# Patient Record
Sex: Male | Born: 1985 | Race: Black or African American | Hispanic: No | Marital: Single | Smoking: Former smoker
Health system: Southern US, Community
[De-identification: ages and names within clinical notes are randomized; demographics above are authoritative.]

## PROBLEM LIST (undated history)

## (undated) DIAGNOSIS — R569 Unspecified convulsions: Secondary | ICD-10-CM

---

## 2016-12-02 ENCOUNTER — Emergency Department
Admission: EM | Admit: 2016-12-02 | Discharge: 2016-12-02 | Disposition: A | Discharge status: Home / Self Care | Payer: Self-pay | Attending: Emergency Medicine | Admitting: Emergency Medicine

## 2016-12-02 DIAGNOSIS — R197 Diarrhea, unspecified: Secondary | ICD-10-CM | POA: Insufficient documentation

## 2016-12-02 DIAGNOSIS — R1084 Generalized abdominal pain: Secondary | ICD-10-CM | POA: Insufficient documentation

## 2016-12-02 DIAGNOSIS — R509 Fever, unspecified: Secondary | ICD-10-CM | POA: Insufficient documentation

## 2016-12-02 DIAGNOSIS — F17210 Nicotine dependence, cigarettes, uncomplicated: Secondary | ICD-10-CM | POA: Insufficient documentation

## 2016-12-02 HISTORY — DX: Unspecified convulsions: R56.9

## 2016-12-02 LAB — COMPREHENSIVE METABOLIC PANEL
ALK PHOS: 85 U/L (ref 38–126)
ALT: 30 U/L (ref 17–63)
AST: 30 U/L (ref 15–41)
Albumin: 3.8 g/dL (ref 3.5–5.0)
Anion gap: 11 (ref 5–15)
BILIRUBIN TOTAL: 0.7 mg/dL (ref 0.3–1.2)
BUN: 9 mg/dL (ref 6–20)
CALCIUM: 9.2 mg/dL (ref 8.9–10.3)
CHLORIDE: 97 mmol/L — AB (ref 101–111)
CO2: 24 mmol/L (ref 22–32)
CREATININE: 1.07 mg/dL (ref 0.61–1.24)
GFR calc Af Amer: >60 mL/min (ref >60)
GLUCOSE: 108 mg/dL — AB (ref 65–99)
Potassium: 3.3 mmol/L — ABNORMAL LOW (ref 3.5–5.1)
Sodium: 132 mmol/L — ABNORMAL LOW (ref 135–145)
Total Protein: 8.9 g/dL — ABNORMAL HIGH (ref 6.5–8.1)

## 2016-12-02 LAB — CBC
HCT: 42.9 % (ref 40.0–52.0)
HEMOGLOBIN: 15.2 g/dL (ref 13.0–18.0)
MCH: 28.8 pg (ref 26.0–34.0)
MCHC: 35.4 g/dL (ref 32.0–36.0)
MCV: 81.5 fL (ref 80.0–100.0)
PLATELETS: 266 10*3/uL (ref 150–440)
RBC: 5.26 MIL/uL (ref 4.40–5.90)
RDW: 13 % (ref 11.5–14.5)
WBC: 11 10*3/uL — ABNORMAL HIGH (ref 3.8–10.6)

## 2016-12-02 LAB — URINALYSIS, COMPLETE (UACMP) WITH MICROSCOPIC
Bacteria, UA: NONE SEEN
Bilirubin Urine: NEGATIVE
GLUCOSE, UA: NEGATIVE mg/dL
Hgb urine dipstick: NEGATIVE
KETONES UR: 20 mg/dL — AB
Leukocytes, UA: NEGATIVE
Nitrite: NEGATIVE
PROTEIN: 100 mg/dL — AB
Specific Gravity, Urine: 1.038 — ABNORMAL HIGH (ref 1.005–1.030)
pH: 5 (ref 5.0–8.0)

## 2016-12-02 LAB — LIPASE, BLOOD: Lipase: 19 U/L (ref 11–51)

## 2016-12-02 MED ORDER — ONDANSETRON HCL 4 MG PO TABS: 4.0000 mg | ORAL_TABLET | Freq: Three times a day (TID) | ORAL | 0 refills | Status: DC | PRN

## 2016-12-02 MED ORDER — KETOROLAC TROMETHAMINE 30 MG/ML IJ SOLN
15.0000 mg | Freq: Once | INTRAMUSCULAR | Status: AC
  Administered 2016-12-02: 15 mg via INTRAVENOUS
  Filled 2016-12-02: qty 1

## 2016-12-02 MED ORDER — ACETAMINOPHEN 500 MG PO TABS
1000.0000 mg | ORAL_TABLET | Freq: Once | ORAL | Status: AC
  Administered 2016-12-02: 1000 mg via ORAL
  Filled 2016-12-02: qty 2

## 2016-12-02 MED ORDER — ONDANSETRON HCL 4 MG/2ML IJ SOLN
4.0000 mg | Freq: Once | INTRAMUSCULAR | Status: AC
  Administered 2016-12-02: 4 mg via INTRAVENOUS
  Filled 2016-12-02: qty 2

## 2016-12-02 MED ORDER — SODIUM CHLORIDE 0.9 % IV BOLUS (SEPSIS)
1000.0000 mL | Freq: Once | INTRAVENOUS | Status: AC
  Administered 2016-12-02: 1000 mL via INTRAVENOUS

## 2016-12-02 NOTE — ED Notes (Signed)

## 2016-12-02 NOTE — ED Provider Notes (Addendum)
[facility] Emergency Department Provider Note  ____________________________________________  Time seen: Approximately 6:50 PM  I have reviewed the triage vital signs and the nursing notes.   HISTORY  Chief Complaint Abdominal Pain   HPI Craig Shepard is a 31 y.o. male the history of seizure disorder who presents for evaluation of abdominal pain and fever. Patient reports 5 days of decreased appetite, generalized cramping intermittent abdominal pain, currently 8/10, 2 daily episodes of watery diarrhea, and nausea. Has had a fever for the last 2 days. He denies any prior abdominal surgeries. No vomiting. No chest pain or shortness of breath, no dysuria or hematuria. He denies any known sick contact exposures.No melena, no bright red blood per rectum. No recent antibiotic use, no history of C. difficile.  Past Medical History:  Diagnosis Date  . Seizures (HCC)     There are no active problems to display for this patient.   History reviewed. No pertinent surgical history.  Prior to Admission medications   Not on File    Allergies Patient has no known allergies.  No family history on file.  Social History Social History  Substance Use Topics  . Smoking status: Current Some Day Smoker  . Smokeless tobacco: Not on file  . Alcohol use No    Review of Systems  Constitutional: + fever. Eyes: Negative for visual changes. ENT: Negative for sore throat. Neck: No neck pain  Cardiovascular: Negative for chest pain. Respiratory: Negative for shortness of breath. Gastrointestinal: + diffuse cramping abdominal pain, nausea, and diarrhea. No vomiting Genitourinary: Negative for dysuria. Musculoskeletal: Negative for back pain. Skin: Negative for rash. Neurological: Negative for headaches, weakness or numbness. Psych: No SI or HI  ____________________________________________   PHYSICAL EXAM:  VITAL SIGNS: ED Triage Vitals  Enc Vitals Group     BP 12/02/16 1839 124/73     Pulse Rate 12/02/16 1839 94     Resp 12/02/16 1839 18     Temp 12/02/16 1839 (!) 102.7 F (39.3 C)     Temp Source 12/02/16 1839 Oral     SpO2 12/02/16 1839 97 %     Weight 12/02/16 1837 250 lb (113.4 kg)     Height 12/02/16 1837  (1.803 m)     Head Circumference --      Peak Flow --      Pain Score --      Pain Loc --      Pain Edu? --      Excl. in GC? --     Constitutional: Alert and oriented. Well appearing and in no apparent distress. HEENT:      Head: Normocephalic and atraumatic.         Eyes: Conjunctivae are normal. Sclera is non-icteric.       Mouth/Throat: Mucous membranes are moist.       Neck: Supple with no signs of meningismus. Cardiovascular: Regular rate and rhythm. No murmurs, gallops, or rubs. 2+ symmetrical distal pulses are present in all extremities. No JVD. Respiratory: Normal respiratory effort. Lungs are clear to auscultation bilaterally. No wheezes, crackles, or rhonchi.  Gastrointestinal: Obese, soft, non tender, and non distended with positive bowel sounds. No rebound or guarding. Genitourinary: No CVA tenderness. Musculoskeletal: Nontender with normal range of motion in all extremities. No edema, cyanosis, or erythema of extremities. Neurologic: Normal speech and language. Face is symmetric. Moving all extremities. No gross focal neurologic deficits are appreciated. Skin: Skin is warm, dry and intact. No rash noted.  Psychiatric: Mood and affect are normal. Speech and behavior are normal.  ____________________________________________   LABS (all labs ordered are listed, but only abnormal results are displayed)  Labs Reviewed  COMPREHENSIVE METABOLIC PANEL - Abnormal; Notable for the following:       Result Value   Sodium 132 (*)    Potassium 3.3 (*)    Chloride 97 (*)    Glucose, Bld 108 (*)    Total Protein 8.9 (*)    All other components within normal limits  CBC - Abnormal; Notable for the following:     WBC 11.0 (*)    All other components within normal limits  URINALYSIS, COMPLETE (UACMP) WITH MICROSCOPIC - Abnormal; Notable for the following:    Color, Urine AMBER (*)    APPearance HAZY (*)    Specific Gravity, Urine 1.038 (*)    Ketones, ur 20 (*)    Protein, ur 100 (*)    Squamous Epithelial / LPF 0-5 (*)    All other components within normal limits  LIPASE, BLOOD   ____________________________________________  EKG  none  ____________________________________________  RADIOLOGY  none  ____________________________________________   PROCEDURES  Procedure(s) performed: None Procedures Critical Care performed:  None ____________________________________________   INITIAL IMPRESSION / ASSESSMENT AND PLAN / ED COURSE  31 y.o. male the history of seizure disorder who presents for evaluation of cramping diffuse abdominal pain, fever, decreased appetite, and 2 daily episodes of watery diarrhea. Patient is well-appearing, in no distress, has a fever of 102.33F, otherwise normal vital signs, his abdomen is obese and soft with no tenderness throughout. Patient actually says that his pain feels better when I push on his abdomen. Patient looks well-hydrated. Not having enough diarrhea to make me concerned for C. difficile at this time. We'll check basic blood work, we'll give Toradol, Tylenol, Zofran, and fluids and reassess. Presentation concerning for gastroenteritis. With no right lower quadrant tenderness. Appendicitis is less likely.    _________________________ 10:49 PM on 12/02/2016 -----------------------------------------  Serial abdominal exam with no tenderness to palpation. Patient were no episodes of vomiting or diarrhea in the emergency room. Tolerating by mouth. Temperature 98.6. UA with no evidence of infection, lipase within normal limits, CBC and CMP within normal limits. Patient's can be discharged home with increase oral hydration, Tylenol/ibuprofen for body aches and  fever and close follow-up with primary care doctor. Recommend he return to the emergency room if he has right upper quadrant or right lower quadrant abdominal pain, if he is unable to tolerate by mouth, if he is feeling dehydrated, or if he develops any other symptoms.  Pertinent labs & imaging results that were available during my care of the patient were reviewed by me and considered in my medical decision making (see chart for details).    ____________________________________________   FINAL CLINICAL IMPRESSION(S) / ED DIAGNOSES  Final diagnoses:  Generalized abdominal pain  Diarrhea of presumed infectious origin  Fever, unspecified fever cause      NEW MEDICATIONS STARTED DURING THIS VISIT:  New Prescriptions   No medications on file     Note:  This document was prepared using Dragon voice recognition software and may include unintentional dictation errors.    Nita Sickle, MD 12/02/16 2250    Don Perking [place], MD 12/02/16 2252

## 2016-12-02 NOTE — Discharge Instructions (Signed)

## 2016-12-02 NOTE — ED Triage Notes (Signed)
Pt c/o generalized abdominal pain since [place]. Denies urinary symptoms. No n/v. Reports some diarrhea. Temperature 102.7.

## 2016-12-02 NOTE — ED Notes (Signed)
Pt placed in gown with clothes removed from waist up. BP cuff and oxy meter placed on pt. Pt given sheet to cover with, pt told no blanket since running a fever. Pt is resting comfortably watching the football game and pt informed that RN is with another pt, but will be with him shortly. Pt informed UA is needed, pt given urine cup and shown where BR is.

## 2017-05-01 ENCOUNTER — Emergency Department
Admission: EM | Admit: 2017-05-01 | Discharge: 2017-05-01 | Disposition: A | Discharge status: Home / Self Care | Payer: Self-pay | Attending: Emergency Medicine | Admitting: Emergency Medicine

## 2017-05-01 ENCOUNTER — Other Ambulatory Visit: Payer: Self-pay

## 2017-05-01 DIAGNOSIS — L02411 Cutaneous abscess of right axilla: Secondary | ICD-10-CM | POA: Insufficient documentation

## 2017-05-01 DIAGNOSIS — F17210 Nicotine dependence, cigarettes, uncomplicated: Secondary | ICD-10-CM | POA: Insufficient documentation

## 2017-05-01 MED ORDER — SULFAMETHOXAZOLE-TRIMETHOPRIM 800-160 MG PO TABS: 1.0000 | ORAL_TABLET | Freq: Two times a day (BID) | ORAL | 0 refills | Status: DC

## 2017-05-01 MED ORDER — SULFAMETHOXAZOLE-TRIMETHOPRIM 800-160 MG PO TABS
1.0000 | ORAL_TABLET | Freq: Once | ORAL | Status: AC
  Administered 2017-05-01: 1 via ORAL
  Filled 2017-05-01: qty 1

## 2017-05-01 NOTE — ED Triage Notes (Signed)
Abscess to right axilla that appeared last night. Pt alert and oriented X4, active, cooperative, pt in NAD. RR even and unlabored, color WNL.

## 2017-05-01 NOTE — ED Provider Notes (Signed)
[facility] Emergency Department Provider Note  ____________________________________________   First MD Initiated Contact with Patient 05/01/17 [phone]     (approximate)  I have reviewed the triage vital signs and the nursing notes.   HISTORY  Chief Complaint Abscess    HPI Craig Shepard is a 32 y.o. male presents to the emergency department complaining of an abscess right below the right axilla.  He states he noticed it last night.  He has a history of the same.  He denies any fever or chills.  Past Medical History:  Diagnosis Date  . Seizures (HCC)     There are no active problems to display for this patient.   History reviewed. No pertinent surgical history.  Prior to Admission medications   Medication Sig Start Date End Date Taking? Authorizing Provider  ondansetron (ZOFRAN) 4 MG tablet Take 1 tablet (4 mg total) by mouth every 8 (eight) hours as needed for nausea or vomiting. 12/02/16   Don Perking, [place], MD  sulfamethoxazole-trimethoprim (BACTRIM DS,SEPTRA DS) 800-160 MG tablet Take 1 tablet by mouth 2 (two) times daily. 05/01/17   Faythe Ghee, PA-C    Allergies Patient has no known allergies.  No family history on file.  Social History Social History   Tobacco Use  . Smoking status: Current Some Day Smoker  Substance Use Topics  . Alcohol use: No  . Drug use: Not on file    Review of Systems  Constitutional: No fever/chills Eyes: No visual changes. ENT: No sore throat. Respiratory: Denies cough Genitourinary: Negative for dysuria. Musculoskeletal: Negative for back pain. Skin: Negative for rash.  Positive for abscess below the right axilla    ____________________________________________   PHYSICAL EXAM:  VITAL SIGNS: ED Triage Vitals [05/01/17 0706]  Enc Vitals Group     BP 132/73     Pulse Rate 61     Resp 14     Temp 98.7 F (37.1 C)     Temp Source Oral     SpO2 98 %     Weight 250 lb (113.4 kg)   Height 5\' 11"  (1.803 m)     Head Circumference      Peak Flow      Pain Score 6     Pain Loc      Pain Edu?      Excl. in GC?     Constitutional: Alert and oriented. Well appearing and in no acute distress. Eyes: Conjunctivae are normal.  Head: Atraumatic. Nose: No congestion/rhinnorhea. Mouth/Throat: Mucous membranes are moist.   Cardiovascular: Normal rate, regular rhythm.  Heart sounds are normal Respiratory: Normal respiratory effort.  No retractions, lungs clear to auscultation GU: deferred Musculoskeletal: FROM all extremities, warm and well perfused Neurologic:  Normal speech and language.  Skin:  Skin is warm, dry and intact.  Positive for a $0.50 piece sized red swollen area.  It is apparent there is been recent drainage from the site.  Neurovascular is intact. Psychiatric: Mood and affect are normal. Speech and behavior are normal.  ____________________________________________   LABS (all labs ordered are listed, but only abnormal results are displayed)  Labs Reviewed - No data to display ____________________________________________   ____________________________________________  RADIOLOGY    ____________________________________________   PROCEDURES  Procedure(s) performed: No  Procedures    ____________________________________________   INITIAL IMPRESSION / ASSESSMENT AND PLAN / ED COURSE  Pertinent labs & imaging results that were available during my care of the patient were reviewed by me and considered  in my medical decision making (see chart for details).  Patient is 32 year old male presents emergency department complaining of an abscess below the right axilla  On physical exam the area along the right flank has a small abscess that is already draining  Diagnosis is abscess of the right axilla.  Septra DS 1 p.o. was given while in the ED.  Prescription for additional Septra was provided.  Patient is to apply warm compress to the area.  He is  to follow-up with his regular doctor if not better in 3-5 days.  He is return to the emergency department if the area is worsening.  Patient states he understands and will comply with instructions.  He was discharged in stable condition     As part of my medical decision making, I reviewed the following data within the electronic MEDICAL RECORD NUMBER Nursing notes reviewed and incorporated, Notes from prior ED visits a ____________________________________________   FINAL CLINICAL IMPRESSION(S) / ED DIAGNOSES  Final diagnoses:  Abscess of axilla, right      NEW MEDICATIONS STARTED DURING THIS VISIT:  Discharge Medication List as of 05/01/2017  7:24 AM       Note:  This document was prepared using Dragon voice recognition software and may include unintentional dictation errors.    Faythe GheeFisher, Olumide Dolinger W, PA-C 05/01/17 [id number]    Nita SickleVeronese, [place], MD 05/02/17 1455

## 2017-05-01 NOTE — Discharge Instructions (Signed)
Apply warm compress to the area.  If the area becomes larger and appears to be more infected please return to emergency department for evaluation.  He could also see her regular doctor.  Take medication as prescribed

## 2017-06-07 ENCOUNTER — Emergency Department
Admission: EM | Admit: 2017-06-07 | Discharge: 2017-06-07 | Disposition: A | Discharge status: Home / Self Care | Payer: Self-pay | Attending: Emergency Medicine | Admitting: Emergency Medicine

## 2017-06-07 ENCOUNTER — Encounter: Payer: Self-pay | Admitting: Emergency Medicine

## 2017-06-07 DIAGNOSIS — R1033 Periumbilical pain: Secondary | ICD-10-CM | POA: Insufficient documentation

## 2017-06-07 DIAGNOSIS — F17200 Nicotine dependence, unspecified, uncomplicated: Secondary | ICD-10-CM | POA: Insufficient documentation

## 2017-06-07 LAB — CBC WITH DIFFERENTIAL/PLATELET
BASOS ABS: 0 10*3/uL (ref 0–0.1)
Basophils Relative: 1 %
EOS ABS: 0.1 10*3/uL (ref 0–0.7)
EOS PCT: 1 %
HCT: 45.1 % (ref 40.0–52.0)
Hemoglobin: 14.7 g/dL (ref 13.0–18.0)
LYMPHS ABS: 1.8 10*3/uL (ref 1.0–3.6)
Lymphocytes Relative: 33 %
MCH: 27.4 pg (ref 26.0–34.0)
MCHC: 32.5 g/dL (ref 32.0–36.0)
MCV: 84 fL (ref 80.0–100.0)
Monocytes Absolute: 0.4 10*3/uL (ref 0.2–1.0)
Monocytes Relative: 8 %
NEUTROS PCT: 57 %
Neutro Abs: 3.1 10*3/uL (ref 1.4–6.5)
PLATELETS: 278 10*3/uL (ref 150–440)
RBC: 5.36 MIL/uL (ref 4.40–5.90)
RDW: 13.5 % (ref 11.5–14.5)
WBC: 5.5 10*3/uL (ref 3.8–10.6)

## 2017-06-07 MED ORDER — ONDANSETRON HCL 4 MG/2ML IJ SOLN
4.0000 mg | Freq: Once | INTRAMUSCULAR | Status: AC
  Administered 2017-06-07: 4 mg via INTRAVENOUS
  Filled 2017-06-07: qty 2

## 2017-06-07 MED ORDER — KETOROLAC TROMETHAMINE 30 MG/ML IJ SOLN
15.0000 mg | INTRAMUSCULAR | Status: AC
  Administered 2017-06-07: 15 mg via INTRAVENOUS
  Filled 2017-06-07: qty 1

## 2017-06-07 MED ORDER — NAPROXEN 500 MG PO TABS: 500.0000 mg | ORAL_TABLET | Freq: Two times a day (BID) | ORAL | 0 refills | Status: DC

## 2017-06-07 MED ORDER — ONDANSETRON 4 MG PO TBDP: 4.0000 mg | ORAL_TABLET | Freq: Three times a day (TID) | ORAL | 0 refills | Status: DC | PRN

## 2017-06-07 NOTE — Discharge Instructions (Signed)
Your lab tests today were unremarkable.  Follow a bland diet for a few days and drink lots of water to stay hydrated as you monitor your symptoms.

## 2017-06-07 NOTE — ED Provider Notes (Signed)
[facility] Emergency Department Provider Note  ____________________________________________  Time seen: Approximately 8:49 AM  I have reviewed the triage vital signs and the nursing notes.   HISTORY  Chief Complaint Abdominal Pain    HPI Craig Shepard is a 32 y.o. male who complains of abdominal pain that started last night just prior to bedtime, periumbilical, nonradiating, constant, mild severity, no aggravating or alleviating factors. No associated nausea or vomiting. Had one loose bowel movement this morning.      Past Medical History:  Diagnosis Date  . Seizures (HCC)      There are no active problems to display for this patient.    History reviewed. No pertinent surgical history.   Prior to Admission medications   Medication Sig Start Date End Date Taking? Authorizing Provider  naproxen (NAPROSYN) 500 MG tablet Take 1 tablet (500 mg total) by mouth 2 (two) times daily with a meal. 06/07/17   Sharman Cheek, MD  ondansetron (ZOFRAN ODT) 4 MG disintegrating tablet Take 1 tablet (4 mg total) by mouth every 8 (eight) hours as needed for nausea or vomiting. 06/07/17   Sharman Cheek, MD  ondansetron (ZOFRAN) 4 MG tablet Take 1 tablet (4 mg total) by mouth every 8 (eight) hours as needed for nausea or vomiting. Patient not taking: Reported on 06/07/2017 12/02/16   Nita Sickle, MD  sulfamethoxazole-trimethoprim (BACTRIM DS,SEPTRA DS) 800-160 MG tablet Take 1 tablet by mouth 2 (two) times daily. Patient not taking: Reported on 06/07/2017 05/01/17   Faythe Ghee, PA-C     Allergies Patient has no known allergies.   No family history on file.  Social History Social History   Tobacco Use  . Smoking status: Current Some Day Smoker  Substance Use Topics  . Alcohol use: No  . Drug use: Not on file    Review of Systems  Constitutional:   No fever or chills.   Cardiovascular:   No chest pain or syncope. Respiratory:   No  dyspnea or cough. Gastrointestinal:   positive as above for abdominal pain without vomiting and diarrhea.  Musculoskeletal:   Negative for focal pain or swelling All other systems reviewed and are negative except as documented above in ROS and HPI.  ____________________________________________   PHYSICAL EXAM:  VITAL SIGNS: ED Triage Vitals  Enc Vitals Group     BP 06/07/17 0750 123/74     Pulse Rate 06/07/17 0750 69     Resp 06/07/17 0749 17     Temp 06/07/17 0749 98.3 F (36.8 C)     Temp Source 06/07/17 0749 Oral     SpO2 06/07/17 0750 98 %     Weight 06/07/17 0749 250 lb (113.4 kg)     Height 06/07/17 0749 5\' 11"  (1.803 m)     Head Circumference --      Peak Flow --      Pain Score 06/07/17 0749 6     Pain Loc --      Pain Edu? --      Excl. in GC? --     Vital signs reviewed, nursing assessments reviewed.   Constitutional:   Alert and oriented. Well appearing and in no distress. Eyes:   Conjunctivae are normal. EOMI. PERRL. ENT      Head:   Normocephalic and atraumatic.      Nose:   No congestion/rhinnorhea.       Mouth/Throat:   MMM, no pharyngeal erythema. No peritonsillar mass.  Neck:   No meningismus. Full ROM. Hematological/Lymphatic/Immunilogical:   No cervical lymphadenopathy. Cardiovascular:   RRR. Symmetric bilateral radial and DP pulses.  No murmurs.  Respiratory:   Normal respiratory effort without tachypnea/retractions. Breath sounds are clear and equal bilaterally. No wheezes/rales/rhonchi. Gastrointestinal:   Soft with slightly right lower quadrant tenderness, no tenderness at McBurney's point.. Non distended. There is no CVA tenderness.  No rebound, rigidity, or guarding.no hernias Musculoskeletal:   Normal range of motion in all extremities. No joint effusions.  No lower extremity tenderness.  No edema. Neurologic:   Normal speech and language.  Motor grossly intact. No acute focal neurologic deficits are appreciated.  Skin:    Skin is warm,  dry and intact. No rash noted.  No petechiae, purpura, or bullae.  ____________________________________________    LABS (pertinent positives/negatives) (all labs ordered are listed, but only abnormal results are displayed) Labs Reviewed  CBC WITH DIFFERENTIAL/PLATELET   ____________________________________________   EKG    ____________________________________________    RADIOLOGY  No results found.  ____________________________________________   PROCEDURES Procedures  ____________________________________________  DIFFERENTIAL DIAGNOSIS   functional pain, gas pain, constipation, viral enteritis, appendicitis  CLINICAL IMPRESSION / ASSESSMENT AND PLAN / ED COURSE  Pertinent labs & imaging results that were available during my care of the patient were reviewed by me and considered in my medical decision making (see chart for details).      Clinical Course as of Jun 07 920  Fri Jun 07, 2017  0841 patient well appearing no acute distress, presents with minimal right lower quadrant abdominal pain, not at McBurney's point. Vital signs are normal, patient seems unconcerned and is watching TV throughout encounter. I'll check his white blood cell count, give Toradol and Zofran for symptom relief, plan to PO trial and discharge home if wbc not sig. elevated.   [PS]    Clinical Course User Index [PS] Sharman CheekStafford, Lindaann Gradilla, MD     ----------------------------------------- 9:22 AM on 06/07/2017 -----------------------------------------  CBC normal. Vitals remain normal. Suitable for discharge home without further workup.Considering the patient's symptoms, medical history, and physical examination today, I have low suspicion for cholecystitis or biliary pathology, pancreatitis, perforation or bowel obstruction, hernia, intra-abdominal abscess, AAA or dissection, volvulus or intussusception, mesenteric ischemia, or  appendicitis.    ____________________________________________   FINAL CLINICAL IMPRESSION(S) / ED DIAGNOSES    Final diagnoses:  Periumbilical abdominal pain     ED Discharge Orders        Ordered    naproxen (NAPROSYN) 500 MG tablet  2 times daily with meals     06/07/17 0921    ondansetron (ZOFRAN ODT) 4 MG disintegrating tablet  Every 8 hours PRN     06/07/17 0921      Portions of this note were generated with dragon dictation software. Dictation errors may occur despite best attempts at proofreading.    Sharman CheekStafford, Dorothey Oetken, MD 06/07/17 [phone]

## 2017-06-07 NOTE — ED Triage Notes (Signed)
Patient presents to ED via POV from home with c/o abdominal pain that began last night. Patient denies nausea or vomiting but does report diarrhea. Ambulatory to triage. Even and non labored respirations noted.

## 2017-10-03 ENCOUNTER — Other Ambulatory Visit: Payer: Self-pay

## 2017-10-03 ENCOUNTER — Emergency Department
Admission: EM | Admit: 2017-10-03 | Discharge: 2017-10-03 | Disposition: A | Discharge status: Home / Self Care | Payer: Self-pay | Attending: Emergency Medicine | Admitting: Emergency Medicine

## 2017-10-03 DIAGNOSIS — R1033 Periumbilical pain: Secondary | ICD-10-CM | POA: Insufficient documentation

## 2017-10-03 DIAGNOSIS — F17200 Nicotine dependence, unspecified, uncomplicated: Secondary | ICD-10-CM | POA: Insufficient documentation

## 2017-10-03 LAB — URINALYSIS, COMPLETE (UACMP) WITH MICROSCOPIC
BACTERIA UA: NONE SEEN
BILIRUBIN URINE: NEGATIVE
GLUCOSE, UA: NEGATIVE mg/dL
HGB URINE DIPSTICK: NEGATIVE
Ketones, ur: NEGATIVE mg/dL
LEUKOCYTES UA: NEGATIVE
NITRITE: NEGATIVE
Protein, ur: NEGATIVE mg/dL
Specific Gravity, Urine: 1.021 (ref 1.005–1.030)
pH: 6 (ref 5.0–8.0)

## 2017-10-03 LAB — CBC
HEMATOCRIT: 43.1 % (ref 40.0–52.0)
HEMOGLOBIN: 14.6 g/dL (ref 13.0–18.0)
MCH: 28.6 pg (ref 26.0–34.0)
MCHC: 33.9 g/dL (ref 32.0–36.0)
MCV: 84.4 fL (ref 80.0–100.0)
Platelets: 227 10*3/uL (ref 150–440)
RBC: 5.11 MIL/uL (ref 4.40–5.90)
RDW: 13.3 % (ref 11.5–14.5)
WBC: 4.1 10*3/uL (ref 3.8–10.6)

## 2017-10-03 LAB — COMPREHENSIVE METABOLIC PANEL
ALT: 29 U/L (ref 0–44)
ANION GAP: 3 — AB (ref 5–15)
AST: 28 U/L (ref 15–41)
Albumin: 3.2 g/dL — ABNORMAL LOW (ref 3.5–5.0)
Alkaline Phosphatase: 56 U/L (ref 38–126)
BUN: 10 mg/dL (ref 6–20)
CALCIUM: 8.3 mg/dL — AB (ref 8.9–10.3)
CO2: 28 mmol/L (ref 22–32)
Chloride: 108 mmol/L (ref 98–111)
Creatinine, Ser: 0.94 mg/dL (ref 0.61–1.24)
GFR calc non Af Amer: >60 mL/min (ref >60)
GLUCOSE: 100 mg/dL — AB (ref 70–99)
POTASSIUM: 4 mmol/L (ref 3.5–5.1)
Sodium: 139 mmol/L (ref 135–145)
TOTAL PROTEIN: 5.7 g/dL — AB (ref 6.5–8.1)
Total Bilirubin: 0.7 mg/dL (ref 0.3–1.2)

## 2017-10-03 LAB — LIPASE, BLOOD: Lipase: 30 U/L (ref 11–51)

## 2017-10-03 MED ORDER — SODIUM CHLORIDE 0.9 % IV BOLUS: 1000.0000 mL | Freq: Once | INTRAVENOUS | Status: DC

## 2017-10-03 MED ORDER — SIMETHICONE 80 MG PO CHEW
80.0000 mg | CHEWABLE_TABLET | Freq: Once | ORAL | Status: AC
  Administered 2017-10-03: 80 mg via ORAL
  Filled 2017-10-03: qty 1

## 2017-10-03 MED ORDER — SIMETHICONE 80 MG PO CHEW: 80.0000 mg | CHEWABLE_TABLET | Freq: Four times a day (QID) | ORAL | 0 refills | Status: AC | PRN

## 2017-10-03 MED ORDER — ACETAMINOPHEN 500 MG PO TABS
1000.0000 mg | ORAL_TABLET | Freq: Once | ORAL | Status: AC
  Administered 2017-10-03: 1000 mg via ORAL
  Filled 2017-10-03: qty 2

## 2017-10-03 NOTE — ED Triage Notes (Signed)
Pt states he woke with abd pain this morning, denies N/V/Dfever

## 2017-10-03 NOTE — ED Notes (Signed)
AAOx3.  Skin warm and dry.  NAD 
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
## 2017-10-03 NOTE — Discharge Instructions (Signed)
These use simethicone as needed for gas.  You may also take Tylenol if you have pain.  Please take a bland diet until your symptoms have completely resolved.  Return to the emergency department if you develop severe pain, lightheadedness or fainting, fever, inability to keep down fluids, or any other symptoms concerning to you.

## 2017-11-07 ENCOUNTER — Other Ambulatory Visit: Payer: Self-pay

## 2017-11-07 ENCOUNTER — Emergency Department
Admission: EM | Admit: 2017-11-07 | Discharge: 2017-11-07 | Disposition: A | Discharge status: Home / Self Care | Payer: No Typology Code available for payment source | Attending: Emergency Medicine | Admitting: Emergency Medicine

## 2017-11-07 ENCOUNTER — Encounter: Payer: Self-pay | Admitting: Emergency Medicine

## 2017-11-07 ENCOUNTER — Emergency Department: Payer: No Typology Code available for payment source

## 2017-11-07 DIAGNOSIS — Y998 Other external cause status: Secondary | ICD-10-CM | POA: Diagnosis not present

## 2017-11-07 DIAGNOSIS — S39012A Strain of muscle, fascia and tendon of lower back, initial encounter: Secondary | ICD-10-CM | POA: Insufficient documentation

## 2017-11-07 DIAGNOSIS — Y9389 Activity, other specified: Secondary | ICD-10-CM | POA: Insufficient documentation

## 2017-11-07 DIAGNOSIS — F17200 Nicotine dependence, unspecified, uncomplicated: Secondary | ICD-10-CM | POA: Insufficient documentation

## 2017-11-07 DIAGNOSIS — S3992XA Unspecified injury of lower back, initial encounter: Secondary | ICD-10-CM | POA: Diagnosis present

## 2017-11-07 DIAGNOSIS — Y92410 Unspecified street and highway as the place of occurrence of the external cause: Secondary | ICD-10-CM | POA: Diagnosis not present

## 2017-11-07 LAB — URINALYSIS, COMPLETE (UACMP) WITH MICROSCOPIC
BACTERIA UA: NONE SEEN
BILIRUBIN URINE: NEGATIVE
GLUCOSE, UA: NEGATIVE mg/dL
HGB URINE DIPSTICK: NEGATIVE
Ketones, ur: NEGATIVE mg/dL
LEUKOCYTES UA: NEGATIVE
NITRITE: NEGATIVE
PH: 7 (ref 5.0–8.0)
Protein, ur: NEGATIVE mg/dL
SPECIFIC GRAVITY, URINE: 1.017 (ref 1.005–1.030)

## 2017-11-07 MED ORDER — KETOROLAC TROMETHAMINE 30 MG/ML IJ SOLN
30.0000 mg | Freq: Once | INTRAMUSCULAR | Status: AC
  Administered 2017-11-07: 30 mg via INTRAMUSCULAR
  Filled 2017-11-07: qty 1

## 2017-11-07 MED ORDER — IBUPROFEN 600 MG PO TABS: 600.0000 mg | ORAL_TABLET | Freq: Three times a day (TID) | ORAL | 0 refills | Status: AC | PRN

## 2017-11-07 MED ORDER — METHOCARBAMOL 500 MG PO TABS: 500.0000 mg | ORAL_TABLET | Freq: Four times a day (QID) | ORAL | 0 refills | Status: AC | PRN
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
## 2017-11-07 NOTE — Discharge Instructions (Signed)
Follow-up with your primary care provider or [facility] acute care if any continued problems.  Begin taking Profen 600 mg 3 times daily with food.  Also Robaxin 500 mg every 6 hours if needed for muscle spasms.  You may use ice or heat to your back as needed for discomfort.  You most likely will have some soreness and stiffness for the next 4 to 5 days.  Do not drive or operate machinery while taking the Robaxin as it could cause drowsiness and increase your risk for injury.
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
## 2017-11-19 ENCOUNTER — Emergency Department
Admission: EM | Admit: 2017-11-19 | Discharge: 2017-11-19 | Disposition: A | Discharge status: Home / Self Care | Payer: Self-pay | Attending: Emergency Medicine | Admitting: Emergency Medicine

## 2017-11-19 ENCOUNTER — Other Ambulatory Visit: Payer: Self-pay

## 2017-11-19 ENCOUNTER — Encounter: Payer: Self-pay | Admitting: Emergency Medicine

## 2017-11-19 DIAGNOSIS — R569 Unspecified convulsions: Secondary | ICD-10-CM | POA: Insufficient documentation

## 2017-11-19 DIAGNOSIS — F17200 Nicotine dependence, unspecified, uncomplicated: Secondary | ICD-10-CM | POA: Insufficient documentation

## 2017-11-19 LAB — COMPREHENSIVE METABOLIC PANEL
ALT: 31 U/L (ref 0–44)
ANION GAP: 14 (ref 5–15)
AST: 37 U/L (ref 15–41)
Albumin: 4 g/dL (ref 3.5–5.0)
Alkaline Phosphatase: 75 U/L (ref 38–126)
BUN: 12 mg/dL (ref 6–20)
CHLORIDE: 104 mmol/L (ref 98–111)
CO2: 20 mmol/L — ABNORMAL LOW (ref 22–32)
Calcium: 9.1 mg/dL (ref 8.9–10.3)
Creatinine, Ser: 1.25 mg/dL — ABNORMAL HIGH (ref 0.61–1.24)
Glucose, Bld: 93 mg/dL (ref 70–99)
POTASSIUM: 3.9 mmol/L (ref 3.5–5.1)
Sodium: 138 mmol/L (ref 135–145)
Total Bilirubin: 0.6 mg/dL (ref 0.3–1.2)
Total Protein: 7.4 g/dL (ref 6.5–8.1)

## 2017-11-19 LAB — CBC
HCT: 45.1 % (ref 40.0–52.0)
Hemoglobin: 15 g/dL (ref 13.0–18.0)
MCH: 28.2 pg (ref 26.0–34.0)
MCHC: 33.2 g/dL (ref 32.0–36.0)
MCV: 85 fL (ref 80.0–100.0)
Platelets: 293 10*3/uL (ref 150–440)
RBC: 5.3 MIL/uL (ref 4.40–5.90)
RDW: 13.5 % (ref 11.5–14.5)
WBC: 8.5 10*3/uL (ref 3.8–10.6)

## 2017-11-19 LAB — URINE DRUG SCREEN, QUALITATIVE (ARMC ONLY)
Amphetamines, Ur Screen: NOT DETECTED
BARBITURATES, UR SCREEN: NOT DETECTED
COCAINE METABOLITE, UR ~~LOC~~: NOT DETECTED
Cannabinoid 50 Ng, Ur ~~LOC~~: POSITIVE — AB
MDMA (ECSTASY) UR SCREEN: NOT DETECTED
METHADONE SCREEN, URINE: NOT DETECTED
Opiate, Ur Screen: NOT DETECTED
Phencyclidine (PCP) Ur S: NOT DETECTED
TRICYCLIC, UR SCREEN: NOT DETECTED

## 2017-11-19 LAB — URINALYSIS, ROUTINE W REFLEX MICROSCOPIC
Bilirubin Urine: NEGATIVE
Glucose, UA: NEGATIVE mg/dL
Hgb urine dipstick: NEGATIVE
Ketones, ur: NEGATIVE mg/dL
Leukocytes, UA: NEGATIVE
NITRITE: NEGATIVE
PROTEIN: NEGATIVE mg/dL
SPECIFIC GRAVITY, URINE: 1.015 (ref 1.005–1.030)
pH: 6 (ref 5.0–8.0)

## 2017-11-19 LAB — GLUCOSE, CAPILLARY: GLUCOSE-CAPILLARY: 92 mg/dL (ref 70–99)

## 2017-11-19 MED ORDER — LEVETIRACETAM 500 MG PO TABS: 500.0000 mg | ORAL_TABLET | Freq: Two times a day (BID) | ORAL | 2 refills | Status: AC

## 2017-11-19 MED ORDER — LEVETIRACETAM IN NACL 1000 MG/100ML IV SOLN
1000.0000 mg | Freq: Once | INTRAVENOUS | Status: AC
  Administered 2017-11-19: 1000 mg via INTRAVENOUS
  Filled 2017-11-19 (×2): qty 100

## 2017-11-19 MED ORDER — SODIUM CHLORIDE 0.9 % IV BOLUS
1000.0000 mL | Freq: Once | INTRAVENOUS | Status: AC
  Administered 2017-11-19: 1000 mL via INTRAVENOUS

## 2017-11-19 NOTE — Discharge Instructions (Addendum)
Please begin taking your Keppra 500 mg twice daily as prescribed.  Please do not drive a vehicle or swim alone until you have been cleared by neurology.  Return to the emergency department for any further seizure activity, or any other symptom personally concerning to yourself.
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
## 2017-11-19 NOTE — ED Notes (Signed)
Pharmacy called to send keppra 

## 2017-11-19 NOTE — ED Notes (Addendum)
Spoke to pharmacy tech who states IV keppra was accidentally sent back to pharmacy because label not present. Tech states they will bring medication back to ED.

## 2017-11-19 NOTE — ED Notes (Signed)

## 2017-11-19 NOTE — ED Notes (Signed)
Pharmacy called again to send keppra IV.
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
## 2018-03-13 ENCOUNTER — Emergency Department
Admission: EM | Admit: 2018-03-13 | Discharge: 2018-03-14 | Disposition: A | Discharge status: Home / Self Care | Payer: Self-pay | Attending: Emergency Medicine | Admitting: Emergency Medicine

## 2018-03-13 ENCOUNTER — Other Ambulatory Visit: Payer: Self-pay

## 2018-03-13 ENCOUNTER — Encounter: Payer: Self-pay | Admitting: Emergency Medicine

## 2018-03-13 DIAGNOSIS — F1210 Cannabis abuse, uncomplicated: Secondary | ICD-10-CM | POA: Insufficient documentation

## 2018-03-13 DIAGNOSIS — R569 Unspecified convulsions: Secondary | ICD-10-CM

## 2018-03-13 DIAGNOSIS — Z87891 Personal history of nicotine dependence: Secondary | ICD-10-CM | POA: Insufficient documentation

## 2018-03-13 DIAGNOSIS — G40909 Epilepsy, unspecified, not intractable, without status epilepticus: Secondary | ICD-10-CM

## 2018-03-13 DIAGNOSIS — Z79899 Other long term (current) drug therapy: Secondary | ICD-10-CM | POA: Insufficient documentation

## 2018-03-13 DIAGNOSIS — Z9114 Patient's other noncompliance with medication regimen: Secondary | ICD-10-CM | POA: Insufficient documentation

## 2018-03-13 DIAGNOSIS — Z91148 Patient's other noncompliance with medication regimen for other reason: Secondary | ICD-10-CM

## 2018-03-13 LAB — URINALYSIS, COMPLETE (UACMP) WITH MICROSCOPIC
BACTERIA UA: NONE SEEN
Bilirubin Urine: NEGATIVE
GLUCOSE, UA: NEGATIVE mg/dL
KETONES UR: NEGATIVE mg/dL
LEUKOCYTES UA: NEGATIVE
NITRITE: NEGATIVE
PH: 7 (ref 5.0–8.0)
PROTEIN: 30 mg/dL — AB
Specific Gravity, Urine: 1.015 (ref 1.005–1.030)

## 2018-03-13 LAB — BASIC METABOLIC PANEL
ANION GAP: 8 (ref 5–15)
BUN: 10 mg/dL (ref 6–20)
CHLORIDE: 106 mmol/L (ref 98–111)
CO2: 21 mmol/L — ABNORMAL LOW (ref 22–32)
Calcium: 8 mg/dL — ABNORMAL LOW (ref 8.9–10.3)
Creatinine, Ser: 1.03 mg/dL (ref 0.61–1.24)
Glucose, Bld: 109 mg/dL — ABNORMAL HIGH (ref 70–99)
POTASSIUM: 4.5 mmol/L (ref 3.5–5.1)
SODIUM: 135 mmol/L (ref 135–145)

## 2018-03-13 LAB — CBC
HCT: 48.4 % (ref 39.0–52.0)
HEMOGLOBIN: 15.5 g/dL (ref 13.0–17.0)
MCH: 27.7 pg (ref 26.0–34.0)
MCHC: 32 g/dL (ref 30.0–36.0)
MCV: 86.4 fL (ref 80.0–100.0)
NRBC: 0 % (ref 0.0–0.2)
Platelets: 278 10*3/uL (ref 150–400)
RBC: 5.6 MIL/uL (ref 4.22–5.81)
RDW: 12.6 % (ref 11.5–15.5)
WBC: 7.5 10*3/uL (ref 4.0–10.5)

## 2018-03-13 LAB — PHENYTOIN LEVEL, TOTAL: Phenytoin Lvl: <2.5 ug/mL — ABNORMAL LOW (ref 10.0–20.0)

## 2018-03-13 MED ORDER — SODIUM CHLORIDE 0.9 % IV SOLN
1500.0000 mg | Freq: Once | INTRAVENOUS | Status: AC
  Administered 2018-03-13: 1500 mg via INTRAVENOUS
  Filled 2018-03-13: qty 30

## 2018-03-13 MED ORDER — PHENYTOIN SODIUM EXTENDED 300 MG PO CAPS: 300.0000 mg | ORAL_CAPSULE | Freq: Every day | ORAL | 1 refills | Status: AC

## 2018-03-13 MED ORDER — SODIUM CHLORIDE 0.9 % IV SOLN
1500.0000 mg | INTRAVENOUS | Status: DC
  Filled 2018-03-13: qty 30

## 2018-03-13 MED ORDER — PHENYTOIN SODIUM EXTENDED 100 MG PO CAPS
400.0000 mg | ORAL_CAPSULE | Freq: Once | ORAL | Status: AC
  Administered 2018-03-13: 400 mg via ORAL
  Filled 2018-03-13: qty 4

## 2018-03-13 MED ORDER — PHENYTOIN SODIUM EXTENDED 100 MG PO CAPS
400.0000 mg | ORAL_CAPSULE | Freq: Once | ORAL | Status: AC
  Administered 2018-03-14: 400 mg via ORAL
  Filled 2018-03-13: qty 4

## 2018-03-13 MED ORDER — SODIUM CHLORIDE 0.9 % IV BOLUS
500.0000 mL | Freq: Once | INTRAVENOUS | Status: AC
  Administered 2018-03-13: 500 mL via INTRAVENOUS
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
## 2018-03-13 NOTE — ED Notes (Signed)
Pt states IV burning with dilantin being administered.  Attempted to slow infusion rate down, patient still experiencing burning.  Patient refusing IV dilantin at this time, MD notified.  MD to order oral dilantin.

## 2018-03-13 NOTE — ED Notes (Signed)
Per pharmacist, PO Dilantin tablets should be administered 15 minutes or more apart.
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
## 2018-03-13 NOTE — ED Notes (Signed)
Called pharmacy to request medication 
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
## 2018-03-14 NOTE — ED Notes (Signed)
Reviewed discharge instructions, follow-up care, and prescriptions with patient. Patient verbalized understanding of all information reviewed. Patient stable, with no distress noted at this time.    

## 2018-11-04 ENCOUNTER — Emergency Department
Admission: EM | Admit: 2018-11-04 | Discharge: 2018-11-04 | Disposition: A | Discharge status: Home / Self Care | Payer: Self-pay | Attending: Student in an Organized Health Care Education/Training Program | Admitting: Student in an Organized Health Care Education/Training Program

## 2018-11-04 ENCOUNTER — Other Ambulatory Visit: Payer: Self-pay

## 2018-11-04 ENCOUNTER — Encounter: Payer: Self-pay | Admitting: *Deleted

## 2018-11-04 DIAGNOSIS — R112 Nausea with vomiting, unspecified: Secondary | ICD-10-CM | POA: Insufficient documentation

## 2018-11-04 DIAGNOSIS — Z87891 Personal history of nicotine dependence: Secondary | ICD-10-CM | POA: Insufficient documentation

## 2018-11-04 DIAGNOSIS — R197 Diarrhea, unspecified: Secondary | ICD-10-CM | POA: Insufficient documentation

## 2018-11-04 LAB — COMPREHENSIVE METABOLIC PANEL
ALT: 41 U/L (ref 0–44)
AST: 35 U/L (ref 15–41)
Albumin: 4.3 g/dL (ref 3.5–5.0)
Alkaline Phosphatase: 96 U/L (ref 38–126)
Anion gap: 6 (ref 5–15)
BUN: 14 mg/dL (ref 6–20)
CO2: 25 mmol/L (ref 22–32)
Calcium: 8.9 mg/dL (ref 8.9–10.3)
Chloride: 106 mmol/L (ref 98–111)
Creatinine, Ser: 1.06 mg/dL (ref 0.61–1.24)
GFR calc Af Amer: >60 mL/min (ref >60)
GFR calc non Af Amer: >60 mL/min (ref >60)
Glucose, Bld: 98 mg/dL (ref 70–99)
Potassium: 4 mmol/L (ref 3.5–5.1)
Sodium: 137 mmol/L (ref 135–145)
Total Bilirubin: 0.6 mg/dL (ref 0.3–1.2)
Total Protein: 7.6 g/dL (ref 6.5–8.1)

## 2018-11-04 LAB — CBC
HCT: 44 % (ref 39.0–52.0)
Hemoglobin: 14.4 g/dL (ref 13.0–17.0)
MCH: 27.6 pg (ref 26.0–34.0)
MCHC: 32.7 g/dL (ref 30.0–36.0)
MCV: 84.5 fL (ref 80.0–100.0)
Platelets: 306 10*3/uL (ref 150–400)
RBC: 5.21 MIL/uL (ref 4.22–5.81)
RDW: 13 % (ref 11.5–15.5)
WBC: 5.3 10*3/uL (ref 4.0–10.5)
nRBC: 0 % (ref 0.0–0.2)

## 2018-11-04 LAB — LIPASE, BLOOD: Lipase: 24 U/L (ref 11–51)

## 2018-11-04 MED ORDER — ONDANSETRON 4 MG PO TBDP
4.0000 mg | ORAL_TABLET | Freq: Once | ORAL | Status: AC
  Administered 2018-11-04: 4 mg via ORAL
  Filled 2018-11-04: qty 1

## 2018-11-04 MED ORDER — ONDANSETRON HCL 4 MG PO TABS: 4.0000 mg | ORAL_TABLET | Freq: Every day | ORAL | 0 refills | Status: AC | PRN

## 2018-11-04 NOTE — ED Provider Notes (Signed)
[facility] Emergency Department Provider Note    First MD Initiated Contact with Patient 11/04/18 1746     (approximate)  I have reviewed the triage vital signs and the nursing notes.   HISTORY  Chief Complaint Abdominal Pain and Emesis    HPI Alinda Sierrashomas Hardcastle is a 33 y.o. male [place] past medical history presents the ER for evaluation of an episode of vomiting as well as an episode of diarrhea yesterday.  Patient states his symptoms started after he was eating Alfredo at [employer].  Had 1 more episode of vomiting today after eating a chicken sandwich.  Denies any fevers.  Denies any abdominal pain.  Denies any chest pain or shortness of breath.  Does still feel a little nauseated this time but symptoms of otherwise improved.  The vomit has been nonbloody and nonbilious.  Diarrheal episode was simply loose stool no melena or hematochezia.    Past Medical History:  Diagnosis Date   Seizures (HCC)    History reviewed. No pertinent family history. History reviewed. No pertinent surgical history. There are no active problems to display for this patient.     Prior to Admission medications   Medication Sig Start Date End Date Taking? Authorizing Provider  ibuprofen (ADVIL,MOTRIN) 600 MG tablet Take 1 tablet (600 mg total) by mouth every 8 (eight) hours as needed. 11/07/17   Tommi RumpsSummers, Rhonda L, PA-C  levETIRAcetam (KEPPRA) 500 MG tablet Take 1 tablet (500 mg total) by mouth 2 (two) times daily. 11/19/17   Minna AntisPaduchowski, Kevin, MD  methocarbamol (ROBAXIN) 500 MG tablet Take 1 tablet (500 mg total) by mouth every 6 (six) hours as needed for muscle spasms. 11/07/17   Tommi RumpsSummers, Rhonda L, PA-C  ondansetron (ZOFRAN) 4 MG tablet Take 1 tablet (4 mg total) by mouth daily as needed. 11/04/18 11/04/19  Willy Eddyobinson, Asal Teas, MD  phenytoin (DILANTIN) 300 MG ER capsule Take 1 capsule (300 mg total) by mouth at bedtime. 03/13/18   Sharman CheekStafford, Phillip, MD  simethicone (GAS-X) 80 MG chewable  tablet Chew 1 tablet (80 mg total) by mouth 4 (four) times daily as needed for flatulence. 10/03/17 10/03/18  Rockne MenghiniNorman, Anne-Caroline, MD    Allergies Patient has no known allergies.    Social History Social History   Tobacco Use   Smoking status: Former Smoker   Smokeless tobacco: Never Used  Substance Use Topics   Alcohol use: No   Drug use: Yes    Types: Marijuana    Review of Systems Patient denies headaches, rhinorrhea, blurry vision, numbness, shortness of breath, chest pain, edema, cough, abdominal pain, nausea, vomiting, diarrhea, dysuria, fevers, rashes or hallucinations unless otherwise stated above in HPI. ____________________________________________   PHYSICAL EXAM:  VITAL SIGNS: Vitals:   11/04/18 1726  BP: 130/73  Pulse: 60  Resp: 16  Temp: 98.7 F (37.1 C)  SpO2: 98%    Constitutional: Alert and oriented.  Eyes: Conjunctivae are normal.  Head: Atraumatic. Nose: No congestion/rhinnorhea. Mouth/Throat: Mucous membranes are moist.   Neck: No stridor. Painless ROM.  Cardiovascular: Normal rate, regular rhythm. Grossly normal heart sounds.  Good peripheral circulation. Respiratory: Normal respiratory effort.  No retractions. Lungs CTAB. Gastrointestinal: Soft and nontender. No distention. No abdominal bruits. No CVA tenderness. Genitourinary:  Musculoskeletal: No lower extremity tenderness nor edema.  No joint effusions. Neurologic:  Normal speech and language. No gross focal neurologic deficits are appreciated. No facial droop Skin:  Skin is warm, dry and intact. No rash noted. Psychiatric: Mood and affect are  normal. Speech and behavior are normal.  ____________________________________________   LABS (all labs ordered are listed, but only abnormal results are displayed)  Results for orders placed or performed during the hospital encounter of 11/04/18 (from the past 24 hour(s))  Lipase, blood     Status: None   Collection Time: 11/04/18  5:27 PM    Result Value Ref Range   Lipase 24 11 - 51 U/L  Comprehensive metabolic panel     Status: None   Collection Time: 11/04/18  5:27 PM  Result Value Ref Range   Sodium 137 135 - 145 mmol/L   Potassium 4.0 3.5 - 5.1 mmol/L   Chloride 106 98 - 111 mmol/L   CO2 25 22 - 32 mmol/L   Glucose, Bld 98 70 - 99 mg/dL   BUN 14 6 - 20 mg/dL   Creatinine, Ser 1.06 0.61 - 1.24 mg/dL   Calcium 8.9 8.9 - 10.3 mg/dL   Total Protein 7.6 6.5 - 8.1 g/dL   Albumin 4.3 3.5 - 5.0 g/dL   AST 35 15 - 41 U/L   ALT 41 0 - 44 U/L   Alkaline Phosphatase 96 38 - 126 U/L   Total Bilirubin 0.6 0.3 - 1.2 mg/dL   GFR calc non Af Amer >60 >60 mL/min   GFR calc Af Amer >60 >60 mL/min   Anion gap 6 5 - 15  CBC     Status: None   Collection Time: 11/04/18  5:27 PM  Result Value Ref Range   WBC 5.3 4.0 - 10.5 K/uL   RBC 5.21 4.22 - 5.81 MIL/uL   Hemoglobin 14.4 13.0 - 17.0 g/dL   HCT 44.0 39.0 - 52.0 %   MCV 84.5 80.0 - 100.0 fL   MCH 27.6 26.0 - 34.0 pg   MCHC 32.7 30.0 - 36.0 g/dL   RDW 13.0 11.5 - 15.5 %   Platelets 306 150 - 400 K/uL   nRBC 0.0 0.0 - 0.2 %   ____________________________________________ ____________________________________________  RADIOLOGY   ____________________________________________   PROCEDURES  Procedure(s) performed:  Procedures    Critical Care performed: no ____________________________________________   INITIAL IMPRESSION / ASSESSMENT AND PLAN / ED COURSE  Pertinent labs & imaging results that were available during my care of the patient were reviewed by me and considered in my medical decision making (see chart for details).   DDX: gastritis, enteritis, viral illness, biliary pathology, appy, pancreatitis  Wilbern Pennypacker is a 33 y.o. who presents to the ED with nausea vomiting episode of loose diarrhea.  Patient well-appearing nontoxic.  He is afebrile and hemodynamically stable.  His abdominal exam is soft and benign.  Blood work is reassuring.  Does not seem  consistent with cholelithiasis, cholecystitis or acute appendicitis.  Possible foodborne illness.  Patient was tested for COVID-19 at work today.  Have lower suspicion for this.  Will give Zofran and p.o. challenge.  Clinical Course as of Nov 04 1826  Tue Nov 04, 2018  1825 Patient tolerating oral hydration.  Symptoms improved after Zofran.  Patient is appropriate for trial of outpatient management.  Discussed signs and symptoms for which he should return to the ER.   [PR]    Clinical Course User Index [PR] Merlyn Lot, MD    The patient was evaluated in Emergency Department today for the symptoms described in the history of present illness. He/she was evaluated in the context of the global COVID-19 pandemic, which necessitated consideration that the patient might be at risk  for infection with the SARS-CoV-2 virus that causes COVID-19. Institutional protocols and algorithms that pertain to the evaluation of patients at risk for COVID-19 are in a state of rapid change based on information released by regulatory bodies including the CDC and federal and state organizations. These policies and algorithms were followed during the patient's care in the ED.  As part of my medical decision making, I reviewed the following data within the electronic MEDICAL RECORD NUMBER Nursing notes reviewed and incorporated, Labs reviewed, notes from prior ED visits and [place] Controlled Substance Database   ____________________________________________   FINAL CLINICAL IMPRESSION(S) / ED DIAGNOSES  Final diagnoses:  Nausea vomiting and diarrhea      NEW MEDICATIONS STARTED DURING THIS VISIT:  New Prescriptions   ONDANSETRON (ZOFRAN) 4 MG TABLET    Take 1 tablet (4 mg total) by mouth daily as needed.     Note:  This document was prepared using Dragon voice recognition software and may include unintentional dictation errors.    Willy Eddyobinson, Kaelin Bonelli, MD 11/04/18 Silva [id number]

## 2018-11-04 NOTE — ED Triage Notes (Signed)
Pt to ED reporting intermittent abd pain, vomiting and diarrhea for the past two days. Pt reporting he is vomiting after eating and one episode of diarrhea after eating as well. No fevers and no exposure to others with similar symptoms.

## 2018-11-04 NOTE — Discharge Instructions (Signed)

## 2018-11-04 NOTE — ED Notes (Signed)
Pt alert and oriented X 4, stable for discharge. RR even and unlabored, color WNL. Discussed discharge instructions and follow up when appropriate. Instructed to follow up with ER for any life threatening symptoms or concerns that patient or family of patient may have Able to drink without emesis.

## 2019-05-23 ENCOUNTER — Emergency Department
Admission: EM | Admit: 2019-05-23 | Discharge: 2019-05-23 | Disposition: A | Discharge status: Home / Self Care | Payer: No Typology Code available for payment source | Attending: Student in an Organized Health Care Education/Training Program | Admitting: Student in an Organized Health Care Education/Training Program

## 2019-05-23 ENCOUNTER — Other Ambulatory Visit: Payer: Self-pay

## 2019-05-23 ENCOUNTER — Encounter: Payer: Self-pay | Admitting: Emergency Medicine

## 2019-05-23 ENCOUNTER — Emergency Department: Payer: No Typology Code available for payment source

## 2019-05-23 DIAGNOSIS — S59902A Unspecified injury of left elbow, initial encounter: Secondary | ICD-10-CM | POA: Diagnosis present

## 2019-05-23 DIAGNOSIS — Y999 Unspecified external cause status: Secondary | ICD-10-CM | POA: Diagnosis not present

## 2019-05-23 DIAGNOSIS — S5002XA Contusion of left elbow, initial encounter: Secondary | ICD-10-CM | POA: Insufficient documentation

## 2019-05-23 DIAGNOSIS — Y92410 Unspecified street and highway as the place of occurrence of the external cause: Secondary | ICD-10-CM | POA: Insufficient documentation

## 2019-05-23 DIAGNOSIS — Y9389 Activity, other specified: Secondary | ICD-10-CM | POA: Diagnosis not present

## 2019-05-23 DIAGNOSIS — M791 Myalgia, unspecified site: Secondary | ICD-10-CM | POA: Diagnosis not present

## 2019-05-23 DIAGNOSIS — Z79899 Other long term (current) drug therapy: Secondary | ICD-10-CM | POA: Diagnosis not present

## 2019-05-23 DIAGNOSIS — Z87891 Personal history of nicotine dependence: Secondary | ICD-10-CM | POA: Diagnosis not present

## 2019-05-23 DIAGNOSIS — S60811A Abrasion of right wrist, initial encounter: Secondary | ICD-10-CM | POA: Diagnosis not present

## 2019-05-23 DIAGNOSIS — M7918 Myalgia, other site: Secondary | ICD-10-CM

## 2019-05-23 MED ORDER — CYCLOBENZAPRINE HCL 10 MG PO TABS: 10.0000 mg | ORAL_TABLET | Freq: Three times a day (TID) | ORAL | 0 refills | Status: AC | PRN

## 2019-05-23 MED ORDER — IBUPROFEN 600 MG PO TABS: 600.0000 mg | ORAL_TABLET | Freq: Three times a day (TID) | ORAL | 0 refills | Status: AC | PRN
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
## 2019-05-23 NOTE — ED Triage Notes (Signed)
Small puncture wound to right hand.

## 2019-05-23 NOTE — Discharge Instructions (Signed)
Follow discharge care instruction take medication as directed. °

## 2019-05-23 NOTE — ED Notes (Signed)
Pt signed printed d/c paperwork as topaz frozen.  °

## 2019-05-23 NOTE — ED Notes (Signed)
See triage note. Pt currently A&Ox4. Denies hitting head or losing consciousness. Reports L elbow pain and abrasions to R wrist. No bleeding. Elbows visually equal; no major swelling noted; color equal bilaterally; arms both warm; L/injured arm's radial pulse 2+. Pt able to move arm appropriately but reports it causes inc pain.

## 2019-07-24 IMAGING — CR DG LUMBAR SPINE 2-3V
1 series · 3 of 3 positions shown · non-contrast
Comparison: No prior.

CLINICAL DATA: Back pain

EXAM:
LUMBAR SPINE - 2-3 VIEW

[Series 1: dg lumbar spine 2-3 views · 0.14mm/px · 3 of 3 slices shown]
[im 1/3]
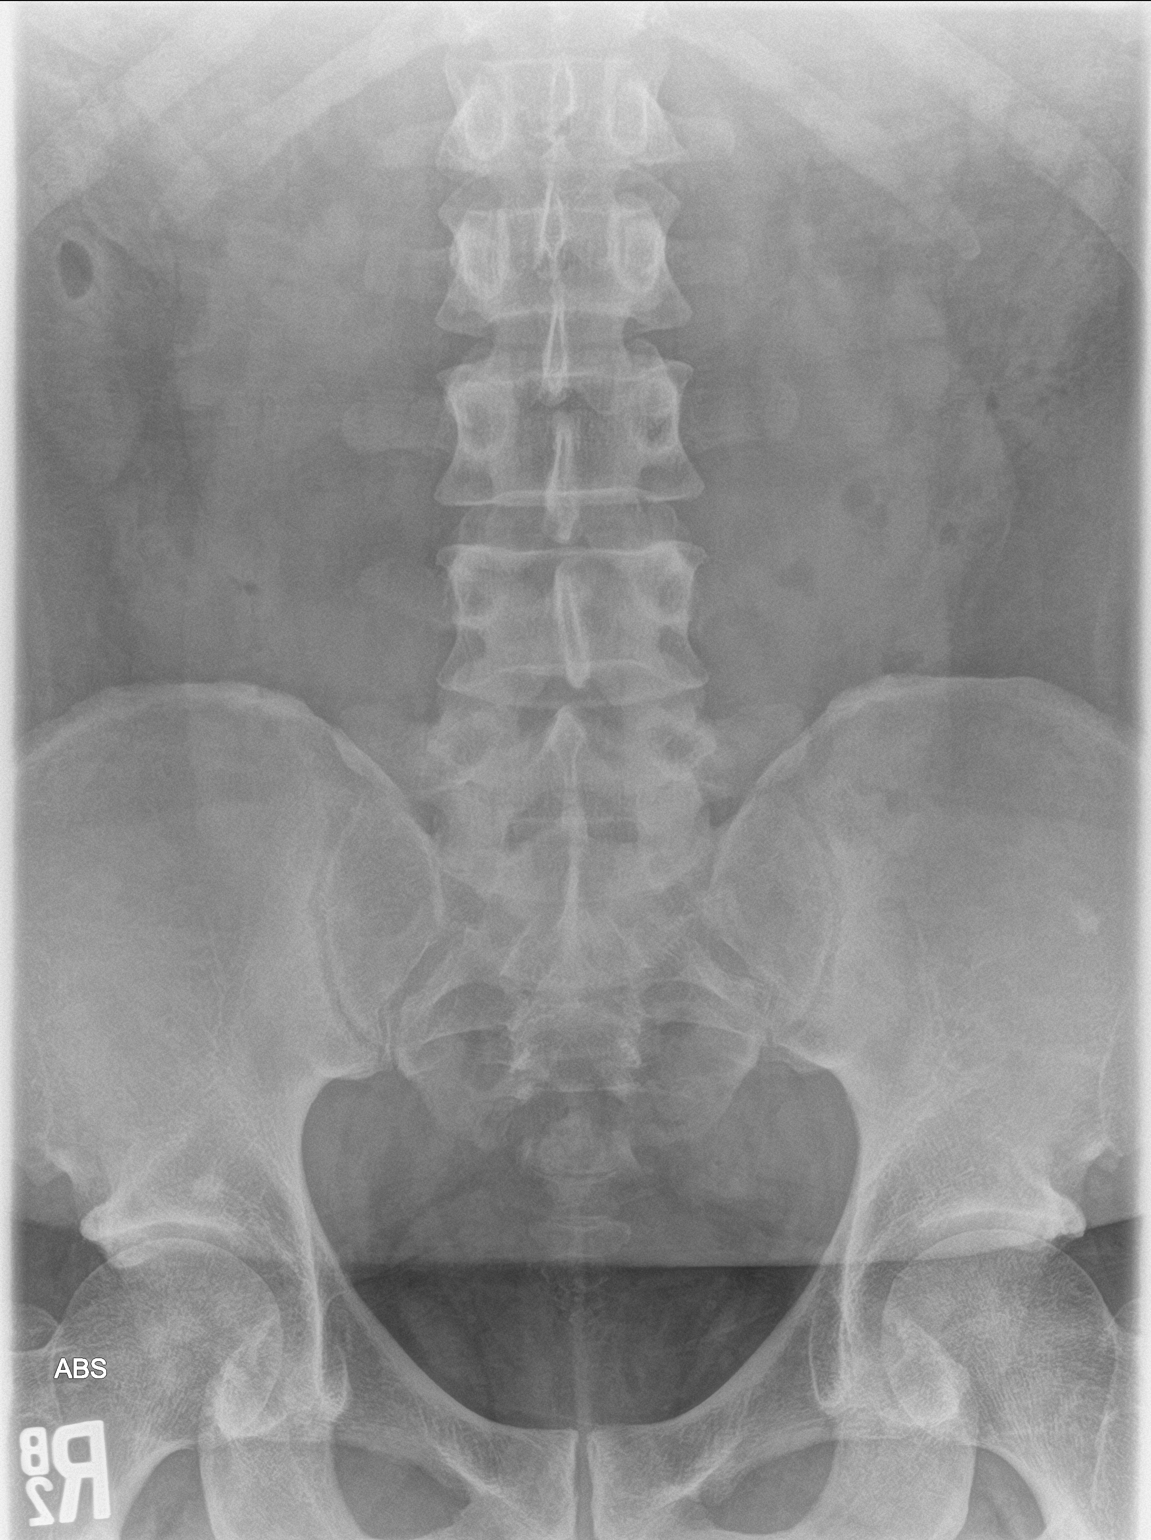
[im 2/3]
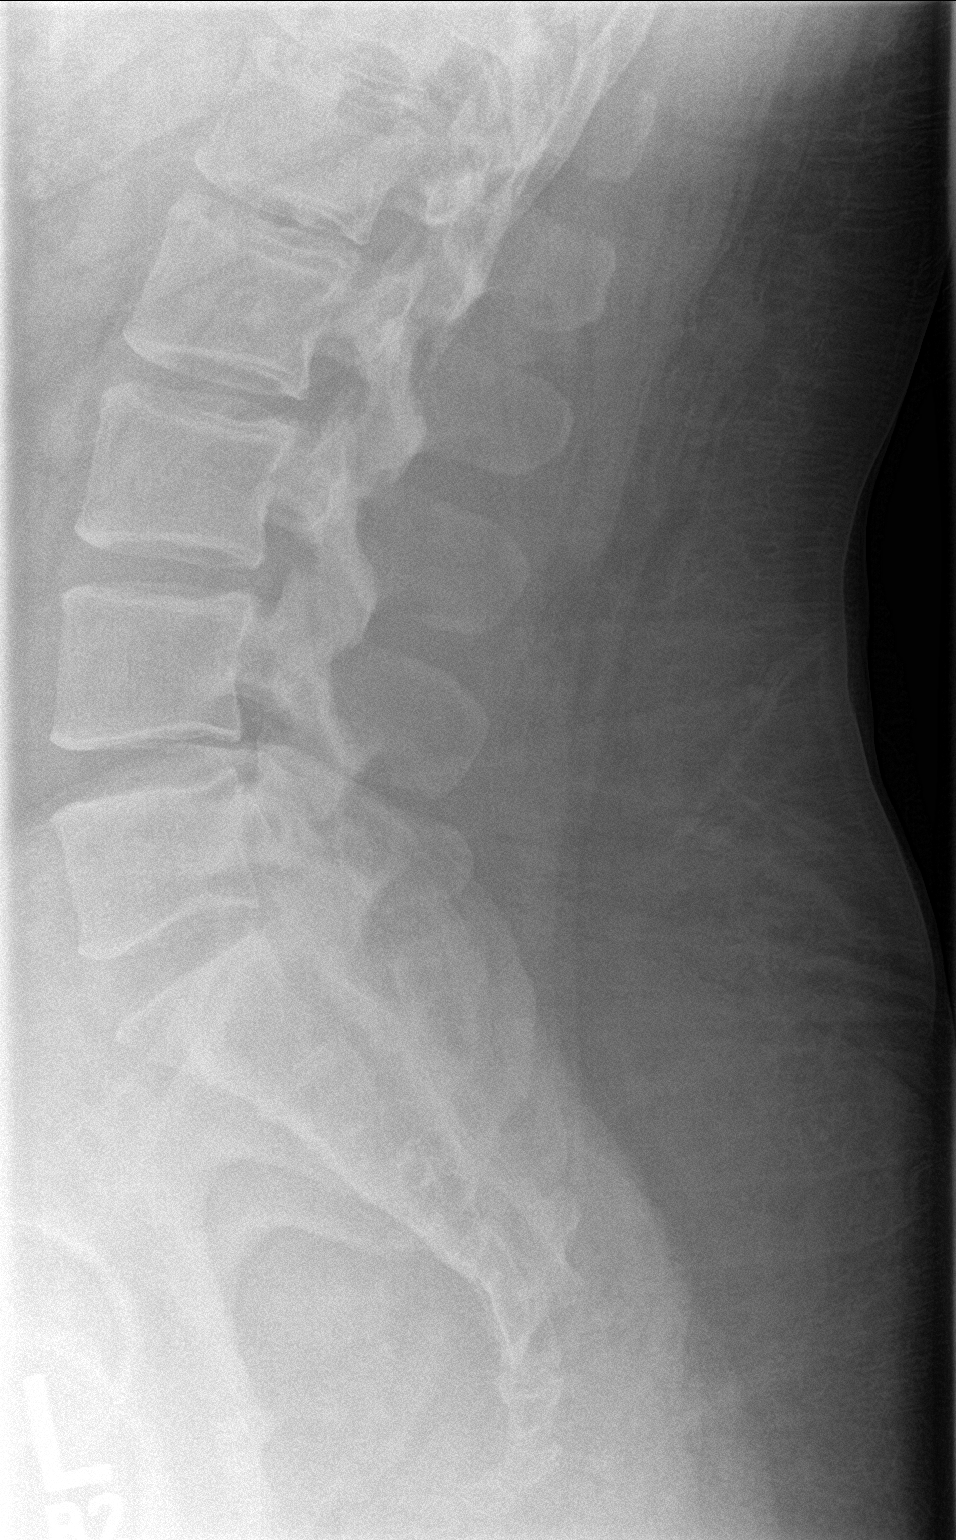
[im 3/3]
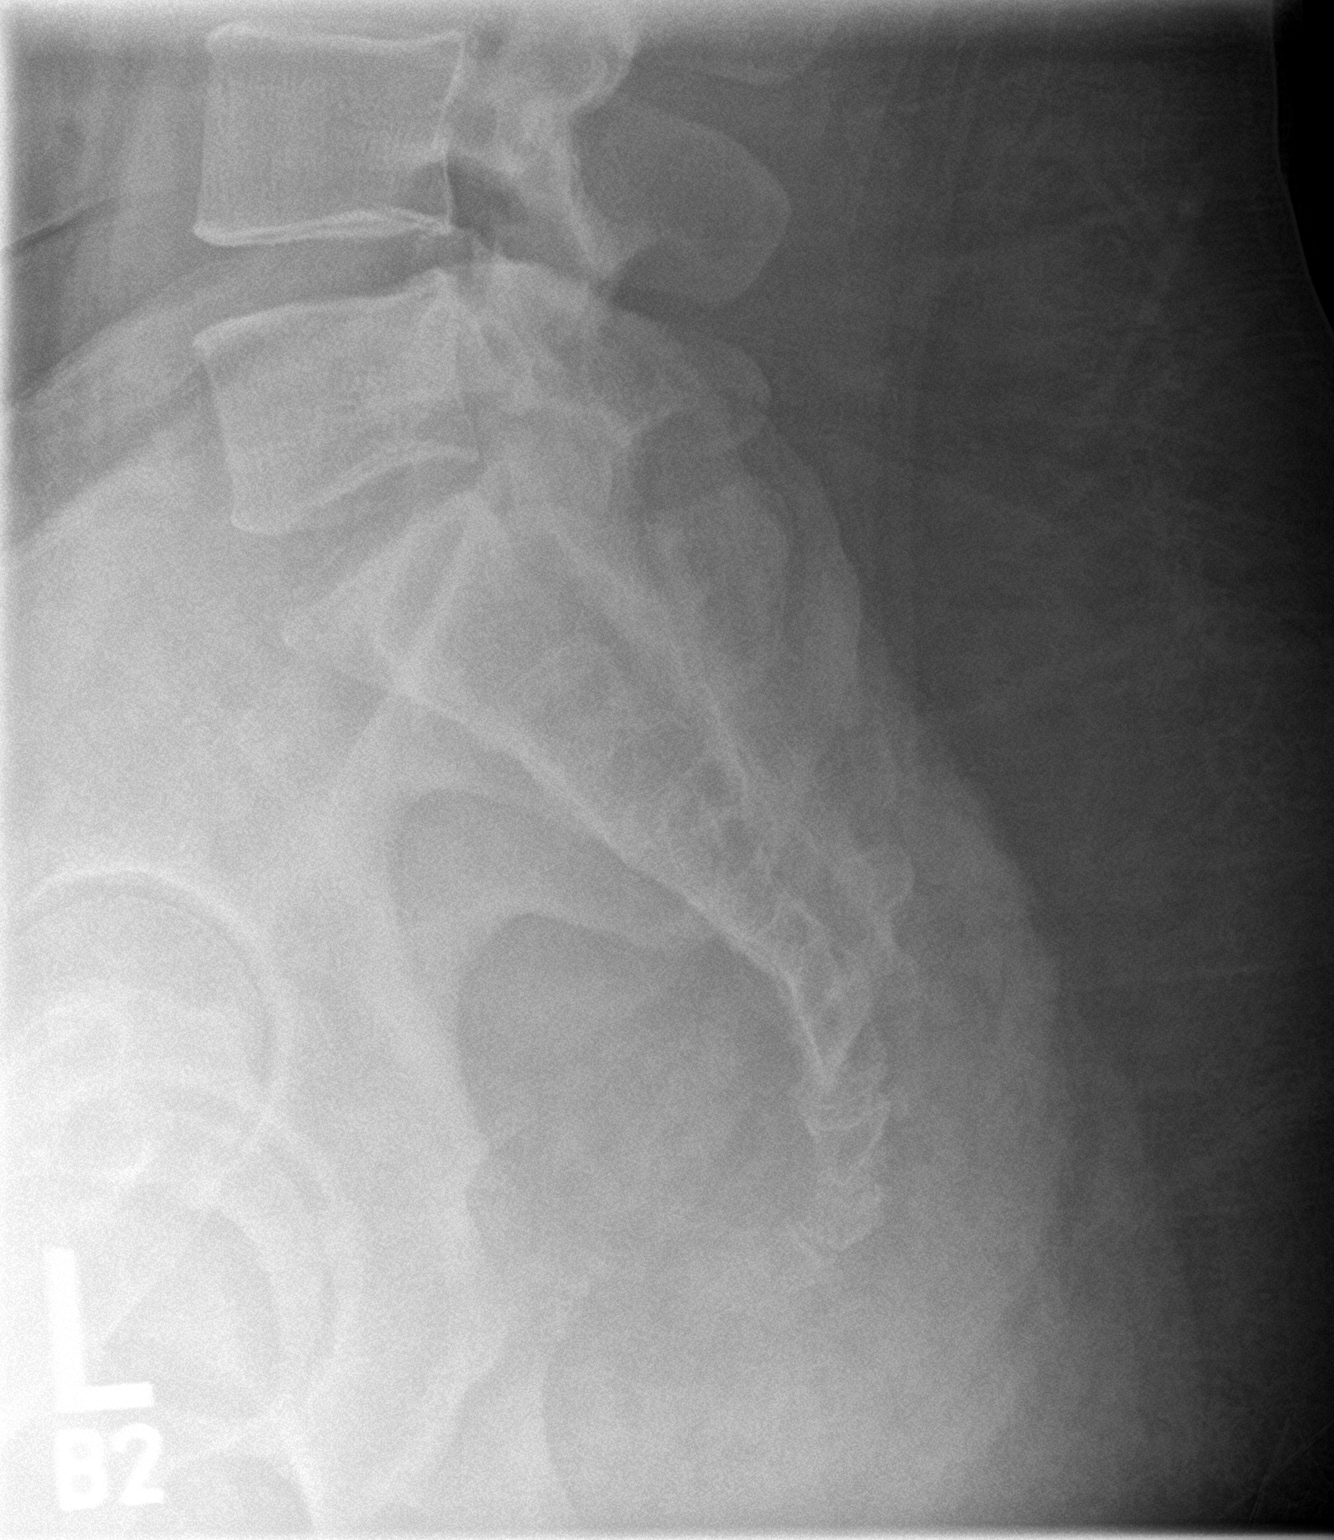

[3 of 3 positions shown; findings below may reference images not displayed]

FINDINGS: No acute bony abnormality identified. Mild diffuse degenerative
changes lumbar spine and both hips. Small rounded densities are
noted over the left iliac crest in the right acetabulum most likely
small bone islands.
IMPRESSION: No acute abnormality. Mild diffuse degenerative changes lumbar
spine.

## 2020-01-24 ENCOUNTER — Other Ambulatory Visit: Payer: Self-pay

## 2020-01-24 ENCOUNTER — Emergency Department
Admission: EM | Admit: 2020-01-24 | Discharge: 2020-01-24 | Disposition: A | Discharge status: Home / Self Care | Payer: Self-pay | Attending: Emergency Medicine | Admitting: Emergency Medicine

## 2020-01-24 ENCOUNTER — Encounter: Payer: Self-pay | Admitting: Emergency Medicine

## 2020-01-24 ENCOUNTER — Emergency Department: Payer: Self-pay

## 2020-01-24 DIAGNOSIS — R1031 Right lower quadrant pain: Secondary | ICD-10-CM | POA: Insufficient documentation

## 2020-01-24 DIAGNOSIS — Z87891 Personal history of nicotine dependence: Secondary | ICD-10-CM | POA: Insufficient documentation

## 2020-01-24 DIAGNOSIS — R197 Diarrhea, unspecified: Secondary | ICD-10-CM | POA: Insufficient documentation

## 2020-01-24 DIAGNOSIS — A084 Viral intestinal infection, unspecified: Secondary | ICD-10-CM

## 2020-01-24 DIAGNOSIS — R110 Nausea: Secondary | ICD-10-CM | POA: Insufficient documentation

## 2020-01-24 LAB — COMPREHENSIVE METABOLIC PANEL
ALT: 47 U/L — ABNORMAL HIGH (ref 0–44)
AST: 38 U/L (ref 15–41)
Albumin: 4.4 g/dL (ref 3.5–5.0)
Alkaline Phosphatase: 89 U/L (ref 38–126)
Anion gap: 10 (ref 5–15)
BUN: 10 mg/dL (ref 6–20)
CO2: 25 mmol/L (ref 22–32)
Calcium: 9 mg/dL (ref 8.9–10.3)
Chloride: 101 mmol/L (ref 98–111)
Creatinine, Ser: 1.06 mg/dL (ref 0.61–1.24)
GFR, Estimated: >60 mL/min (ref >60)
Glucose, Bld: 95 mg/dL (ref 70–99)
Potassium: 3.9 mmol/L (ref 3.5–5.1)
Sodium: 136 mmol/L (ref 135–145)
Total Bilirubin: 1.1 mg/dL (ref 0.3–1.2)
Total Protein: 8 g/dL (ref 6.5–8.1)

## 2020-01-24 LAB — CBC
HCT: 47.9 % (ref 39.0–52.0)
Hemoglobin: 15.6 g/dL (ref 13.0–17.0)
MCH: 27.7 pg (ref 26.0–34.0)
MCHC: 32.6 g/dL (ref 30.0–36.0)
MCV: 84.9 fL (ref 80.0–100.0)
Platelets: 277 10*3/uL (ref 150–400)
RBC: 5.64 MIL/uL (ref 4.22–5.81)
RDW: 12.5 % (ref 11.5–15.5)
WBC: 5.3 10*3/uL (ref 4.0–10.5)
nRBC: 0 % (ref 0.0–0.2)

## 2020-01-24 LAB — LIPASE, BLOOD: Lipase: 21 U/L (ref 11–51)

## 2020-01-24 MED ORDER — ONDANSETRON 4 MG PO TBDP
4.0000 mg | ORAL_TABLET | Freq: Once | ORAL | Status: AC | PRN
  Administered 2020-01-24: 4 mg via ORAL
  Filled 2020-01-24: qty 1

## 2020-01-24 MED ORDER — SODIUM CHLORIDE 0.9 % IV BOLUS
1000.0000 mL | Freq: Once | INTRAVENOUS | Status: AC
  Administered 2020-01-24: 1000 mL via INTRAVENOUS

## 2020-01-24 MED ORDER — IOHEXOL 300 MG/ML  SOLN
100.0000 mL | Freq: Once | INTRAMUSCULAR | Status: AC | PRN
  Administered 2020-01-24: 100 mL via INTRAVENOUS
  Filled 2020-01-24: qty 100

## 2020-01-24 NOTE — ED Triage Notes (Signed)
Pt arrived via POV with c/o diarrhea, nausea and generalized abdominal pain that started this morning.  Pt has had 3 episodes of watery diarrhea.  Pt also c/o chills.

## 2020-01-24 NOTE — Discharge Instructions (Signed)
Try and stick to the food choices to alleviate diarrhea.  Bananas, rice, applesauce, toast, and yogurt will help solidify the stool.  If you are worsening please return emergency department.  Please drink plenty of water to prevent you from becoming dehydrated

## 2020-01-24 NOTE — ED Provider Notes (Signed)
[facility] Emergency Department Provider Note  ____________________________________________   First MD Initiated Contact with Patient 01/24/20 1648     (approximate)  I have reviewed the triage vital signs and the nursing notes.   HISTORY  Chief Complaint Diarrhea and Abdominal Pain    HPI Craig Shepard is a 34 y.o. male presents emergency department with abdominal pain, diarrhea and nausea with decreased appetite that started this morning.  Patient states he is hurting in the right lower quadrant.  States the nausea has gotten much better since he had Zofran at triage.  Diarrhea x4 and it was watery.  Patient does still have his gallbladder and appendix.    Past Medical History:  Diagnosis Date  . Seizures ([facility])     There are no problems to display for this patient.   History reviewed. No pertinent surgical history.  Prior to Admission medications   Medication Sig Start Date End Date Taking? Authorizing Provider  cyclobenzaprine (FLEXERIL) 10 MG tablet Take 1 tablet (10 mg total) by mouth 3 (three) times daily as needed. 05/23/19   Joni Reining, PA-C  ibuprofen (ADVIL) 600 MG tablet Take 1 tablet (600 mg total) by mouth every 8 (eight) hours as needed. 05/23/19   Joni Reining, PA-C  ibuprofen (ADVIL,MOTRIN) 600 MG tablet Take 1 tablet (600 mg total) by mouth every 8 (eight) hours as needed. 11/07/17   Tommi Rumps, PA-C  levETIRAcetam (KEPPRA) 500 MG tablet Take 1 tablet (500 mg total) by mouth 2 (two) times daily. 11/19/17   Minna Antis, MD  methocarbamol (ROBAXIN) 500 MG tablet Take 1 tablet (500 mg total) by mouth every 6 (six) hours as needed for muscle spasms. 11/07/17   Tommi Rumps, PA-C  phenytoin (DILANTIN) 300 MG ER capsule Take 1 capsule (300 mg total) by mouth at bedtime. 03/13/18   Sharman Cheek, MD  simethicone (GAS-X) 80 MG chewable tablet Chew 1 tablet (80 mg total) by mouth 4 (four) times daily as needed for  flatulence. 10/03/17 10/03/18  Rockne Menghini, MD    Allergies Patient has no known allergies.  History reviewed. No pertinent family history.  Social History Social History   Tobacco Use  . Smoking status: Former Games developer  . Smokeless tobacco: Never Used  Substance Use Topics  . Alcohol use: No  . Drug use: Yes    Types: Marijuana    Review of Systems  Constitutional: No fever/chills Eyes: No visual changes. ENT: No sore throat. Respiratory: Denies cough Cardiovascular: Denies chest pain Gastrointestinal: Positive abdominal pain Genitourinary: Negative for dysuria. Musculoskeletal: Negative for back pain. Skin: Negative for rash. Psychiatric: no mood changes,     ____________________________________________   PHYSICAL EXAM:  VITAL SIGNS: ED Triage Vitals  Enc Vitals Group     BP 01/24/20 1502 126/82     Pulse Rate 01/24/20 1502 65     Resp 01/24/20 1502 20     Temp 01/24/20 1502 98.3 F (36.8 C)     Temp Source 01/24/20 1502 Oral     SpO2 01/24/20 1502 97 %     Weight 01/24/20 1459 290 lb (131.5 kg)     Height 01/24/20 1459 5\' 10"  (1.778 m)     Head Circumference --      Peak Flow --      Pain Score 01/24/20 1459 7     Pain Loc --      Pain Edu? --      Excl. in GC? --  Constitutional: Alert and oriented. Well appearing and in no acute distress. Eyes: Conjunctivae are normal.  Head: Atraumatic. Nose: No congestion/rhinnorhea. Mouth/Throat: Mucous membranes are moist.   Neck:  supple no lymphadenopathy noted Cardiovascular: Normal rate, regular rhythm. Heart sounds are normal Respiratory: Normal respiratory effort.  No retractions, lungs c t a  Abd: soft tender in the right lower quadrant bs normal all 4 quad GU: deferred Musculoskeletal: FROM all extremities, warm and well perfused Neurologic:  Normal speech and language.  Skin:  Skin is warm, dry and intact. No rash noted. Psychiatric: Mood and affect are normal. Speech and behavior are  normal.  ____________________________________________   LABS (all labs ordered are listed, but only abnormal results are displayed)  Labs Reviewed  COMPREHENSIVE METABOLIC PANEL - Abnormal; Notable for the following components:      Result Value   ALT 47 (*)    All other components within normal limits  LIPASE, BLOOD  CBC  URINALYSIS, COMPLETE (UACMP) WITH MICROSCOPIC   ____________________________________________   ____________________________________________  RADIOLOGY  CT abdomen/pelvis with IV contrast rule out appendicitis  ____________________________________________   PROCEDURES  Procedure(s) performed: No  Procedures    ____________________________________________   INITIAL IMPRESSION / ASSESSMENT AND PLAN / ED COURSE  Pertinent labs & imaging results that were available during my care of the patient were reviewed by me and considered in my medical decision making (see chart for details).   Patient is 34 year old male presents with right lower quadrant pain and diarrhea.  See HPI.  Physical exam shows patient to be tender in the right lower quadrant.    DDx: Gastroenteritis, right lower quadrant pain, mesenteric adenitis  CBC, lipase and comprehensive metabolic panel are normal.  Did order CT abdomen/pelvis IV contrast to rule out appendicitis    ----------------------------------------- 7:19 PM on 01/24/2020 -----------------------------------------  CT appear to be normal plan I have reviewed.  Radiologist reads as normal except for some wall thickening which may represent colitis.  I did explain these findings to the patient.  Due to the sudden onset today and that he is feeling better I think this is more of a viral enteritis.  He is to follow-up with his regular doctor if not improving in 2 to 3 days.  Return emergency department worsening.  We did discuss the brat diet.  He is to drink fluids.  States he understands.  Is discharged stable  condition.  Craig Shepard was evaluated in Emergency Department on 01/24/2020 for the symptoms described in the history of present illness. He was evaluated in the context of the global COVID-19 pandemic, which necessitated consideration that the patient might be at risk for infection with the SARS-CoV-2 virus that causes COVID-19. Institutional protocols and algorithms that pertain to the evaluation of patients at risk for COVID-19 are in a state of rapid change based on information released by regulatory bodies including the CDC and federal and state organizations. These policies and algorithms were followed during the patient's care in the ED.    As part of my medical decision making, I reviewed the following data within the electronic MEDICAL RECORD NUMBER Nursing notes reviewed and incorporated, Labs reviewed , Old chart reviewed, Radiograph reviewed , Notes from prior ED visits and [place] Controlled Substance Database  ____________________________________________   FINAL CLINICAL IMPRESSION(S) / ED DIAGNOSES  Final diagnoses:  Viral diarrhea      NEW MEDICATIONS STARTED DURING THIS VISIT:  New Prescriptions   No medications on file     Note:  This document was prepared using Dragon voice recognition software and may include unintentional dictation errors.    Faythe Ghee, PA-C 01/24/20 1926    Jene Every, MD 01/24/20 1932
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
